# Patient Record
Sex: Male | Born: 2005 | Race: Black or African American | Hispanic: No | Marital: Single | Smoking: Never smoker
Health system: Southern US, Community
[De-identification: ages and names within clinical notes are randomized; demographics above are authoritative.]

## PROBLEM LIST (undated history)

## (undated) HISTORY — PX: TONSILLECTOMY: SUR1361

---
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
## 2024-05-31 ENCOUNTER — Encounter: Payer: Self-pay | Admitting: Family Medicine

## 2024-05-31 ENCOUNTER — Ambulatory Visit
Admission: EM | Admit: 2024-05-31 | Discharge: 2024-05-31 | Disposition: A | Discharge status: Home / Self Care | Attending: Family Medicine | Admitting: Family Medicine

## 2024-05-31 DIAGNOSIS — J069 Acute upper respiratory infection, unspecified: Secondary | ICD-10-CM | POA: Insufficient documentation

## 2024-05-31 DIAGNOSIS — U071 COVID-19: Secondary | ICD-10-CM | POA: Insufficient documentation

## 2024-05-31 LAB — RESP PANEL BY RT-PCR (FLU A&B, COVID) ARPGX2
Influenza A by PCR: NEGATIVE
Influenza B by PCR: NEGATIVE
SARS Coronavirus 2 by RT PCR: POSITIVE — AB

## 2024-05-31 LAB — GROUP A STREP BY PCR: Group A Strep by PCR: NOT DETECTED

## 2024-05-31 MED ORDER — PROMETHAZINE-DM 6.25-15 MG/5ML PO SYRP: 5.0000 mL | ORAL_SOLUTION | Freq: Four times a day (QID) | ORAL | 0 refills | Status: AC | PRN

## 2024-05-31 NOTE — ED Provider Notes (Signed)
 [facility]    CSN: [id number] Arrival date & time: 05/31/24  1438      History   Chief Complaint Chief Complaint  Patient presents with   Headache   Generalized Body Aches   Cough    HPI Shane Beck is a 18 y.o. male.   HPI  History obtained from the patient.  Shane Beck presents for headache, cough, sore throat, chest discomfort with coughing, nausea, and headache that started on 2 days ago. No wheezing and shortness of breath.  Denies fever, rhinorrhea, nasal congestion, vomiting, diarrhea or ear pain.  Took Tylenol, ibuprofen  and NyQuil.  Her grandma has been sick.    No history of asthma or lung disease. Denies smoking and vaping.       History reviewed. No pertinent past medical history.  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 Authorizing Provider  ibuprofen  (ADVIL ) 800 MG tablet Take 1 tablet (800 mg total) by mouth 3 (three) times daily. 10/06/22   White, Shelba SAUNDERS, NP  lidocaine  (XYLOCAINE ) 2 % solution Use as directed 15 mLs in the mouth or throat every 4 (four) hours as needed for mouth pain. 10/06/22   Teresa Shelba SAUNDERS, NP  naproxen  (NAPROSYN ) 500 MG tablet Take 1 tablet (500 mg total) by mouth 2 (two) times daily with a meal. 06/25/22   Yoneko Talerico, Caprice, DO  promethazine -dextromethorphan (PROMETHAZINE -DM) 6.25-15 MG/5ML syrup Take 5 mLs by mouth 4 (four) times daily as needed for cough. 05/31/24   Jace Fermin, DO  tiZANidine  (ZANAFLEX ) 4 MG tablet Take 1 tablet (4 mg total) by mouth every 8 (eight) hours as needed for muscle spasms. 06/25/22   Jerik Falletta, DO    Family History History reviewed. No pertinent family history.  Social History Social History   Tobacco Use   Smoking status: Never    Passive  exposure: Yes   Smokeless tobacco: Never  Vaping Use   Vaping status: Never Used  Substance Use Topics   Alcohol use: Never   Drug use: Never     Allergies   Patient has no known allergies.   Review of Systems Review of Systems: negative unless otherwise stated in HPI.      Physical Exam Triage Vital Signs ED Triage Vitals  Encounter Vitals Group     BP      Girls Systolic BP Percentile      Girls Diastolic BP Percentile      Boys Systolic BP Percentile      Boys Diastolic BP Percentile      Pulse      Resp      Temp      Temp src      SpO2      Weight      Height      Head Circumference      Peak Flow      Pain Score      Pain Loc      Pain Education      Exclude from Growth Chart    No data found.  Updated Vital Signs BP 127/80 (BP Location: Right Arm)   Pulse 94   Temp 99.5 F (37.5 C) (Oral)   Resp 18  Wt 98.9 kg   SpO2 95%   BMI 29.57 kg/m   Visual Acuity Right Eye Distance:   Left Eye Distance:   Bilateral Distance:    Right Eye Near:   Left Eye Near:    Bilateral Near:     Physical Exam GEN:     alert, non-toxic appearing male in no distress    HENT:  mucus membranes moist, oropharyngeal without lesions, moderate erythema, no tonsillar hypertrophy or exudates, no nasal discharge EYES:   no scleral injection or discharge NECK:  normal ROM, +lymphadenopathy, no meningismus   RESP:  no increased work of breathing, clear to auscultation bilaterally CVS:   regular rate and rhythm Skin:   warm and dry, no rash on visible skin    UC Treatments / Results  Labs (all labs ordered are listed, but only abnormal results are displayed) Labs Reviewed  RESP PANEL BY RT-PCR (FLU A&B, COVID) ARPGX2 - Abnormal; Notable for the following components:      Result Value   SARS Coronavirus 2 by RT PCR POSITIVE (*)    All other components within normal limits  GROUP A STREP BY PCR    EKG   Radiology No results found.   Procedures Procedures  (including critical care time)  Medications Ordered in UC Medications - No data to display  Initial Impression / Assessment and Plan / UC Course  I have reviewed the triage vital signs and the nursing notes.  Pertinent labs & imaging results that were available during my care of the patient were reviewed by me and considered in my medical decision making (see chart for details).       Pt is a 18 y.o. male who presents for 2 days of respiratory symptoms. Veniamin is afebrile here without recent antipyretics. Satting well on room air. Overall pt is non-toxic appearing, well hydrated, without respiratory distress. Pulmonary exam is unremarkable. Strep PCR is negative.  COVID and influenza panel obtained and was COVID positive. Discussed symptomatic treatment.  Explained lack of efficacy of antibiotics in viral disease.  After shared decision making, antivirals are declined. Promethazine  DM for cough. Typical duration of symptoms discussed. Work note provider.   Return and ED precautions given and voiced understanding. Discussed MDM, treatment plan and plan for follow-up with patient who agrees with plan.     Final Clinical Impressions(s) / UC Diagnoses   Final diagnoses:  COVID-19  URI with cough and congestion     Discharge Instructions      Your strep test is negative. Your test for COVID-19 was positive, meaning that you were infected with the novel coronavirus and could give the germ to others.  The recommendations suggest returning to normal activities when, for at least 24 hours, symptoms are improving overall, and if a fever was present, it has been gone without use of a fever-reducing medication.  You should wear a mask for the next 5 days to prevent the spread of disease. Please continue good preventive care measures, including:  frequent hand-washing, avoid touching your face, cover coughs/sneezes, stay out of crowds and keep a 6 foot distance from others.  Go to the nearest  hospital emergency room if fever/cough/breathlessness are severe or illness seems like a threat to life.  If your were prescribed medication. Stop by the pharmacy to pick it up. You can take Tylenol and/or Ibuprofen  as needed for fever reduction and pain relief.    For cough: honey 1/2 to 1 teaspoon (you can dilute the  honey in water or another fluid).  You can also use guaifenesin and dextromethorphan for cough. You can use a humidifier for chest congestion and cough.  If you don't have a humidifier, you can sit in the bathroom with the hot shower running.      For sore throat: try warm salt water gargles, Mucinex sore throat cough drops or cepacol lozenges, throat spray, warm tea or water with lemon/honey, popsicles or ice, or OTC cold relief medicine for throat discomfort. You can also purchase chloraseptic spray at the pharmacy or dollar store.   For congestion: take a daily anti-histamine like Zyrtec, Claritin, and a oral decongestant, such as pseudoephedrine.  You can also use Flonase 1-2 sprays in each nostril daily. Afrin is also a good option, if you do not have high blood pressure.    It is important to stay hydrated: drink plenty of fluids (water, gatorade/powerade/pedialyte, juices, or teas) to keep your throat moisturized and help further relieve irritation/discomfort.    Return or go to the Emergency Department if symptoms worsen or do not improve in the next few days      ED Prescriptions     Medication Sig Dispense Auth. Provider   promethazine -dextromethorphan (PROMETHAZINE -DM) 6.25-15 MG/5ML syrup Take 5 mLs by mouth 4 (four) times daily as needed for cough. 118 mL Christiaan Strebeck, DO      PDMP not reviewed this encounter.   Karin Pinedo, DO 05/31/24 1622

## 2024-05-31 NOTE — ED Triage Notes (Signed)
 Pt presents with a cough, sore throat, headache and bodyaches x 3 days. Pt has taken OTC cold medication for his symptoms.

## 2024-05-31 NOTE — Discharge Instructions (Addendum)
 Your strep test is negative. Your test for COVID-19 was positive, meaning that you were infected with the novel coronavirus and could give the germ to others.  The recommendations suggest returning to normal activities when, for at least 24 hours, symptoms are improving overall, and if a fever was present, it has been gone without use of a fever-reducing medication.  You should wear a mask for the next 5 days to prevent the spread of disease. Please continue good preventive care measures, including:  frequent hand-washing, avoid touching your face, cover coughs/sneezes, stay out of crowds and keep a 6 foot distance from others.  Go to the nearest hospital emergency room if fever/cough/breathlessness are severe or illness seems like a threat to life.  If your were prescribed medication. Stop by the pharmacy to pick it up. You can take Tylenol  and/or Ibuprofen  as needed for fever reduction and pain relief.    For cough: honey 1/2 to 1 teaspoon (you can dilute the honey in water or another fluid).  You can also use guaifenesin and dextromethorphan for cough. You can use a humidifier for chest congestion and cough.  If you don't have a humidifier, you can sit in the bathroom with the hot shower running.      For sore throat: try warm salt water gargles, Mucinex sore throat cough drops or cepacol lozenges, throat spray, warm tea or water with lemon/honey, popsicles or ice, or OTC cold relief medicine for throat discomfort. You can also purchase chloraseptic spray at the pharmacy or dollar store.   For congestion: take a daily anti-histamine like Zyrtec , Claritin, and a oral decongestant, such as pseudoephedrine.  You can also use Flonase  1-2 sprays in each nostril daily. Afrin is also a good option, if you do not have high blood pressure.    It is important to stay hydrated: drink plenty of fluids (water, gatorade/powerade/pedialyte, juices, or teas) to keep your throat moisturized and help further relieve  irritation/discomfort.    Return or go to the Emergency Department if symptoms worsen or do not improve in the next few days

## 2024-06-04 ENCOUNTER — Ambulatory Visit: Admission: EM | Admit: 2024-06-04 | Discharge: 2024-06-04 | Disposition: A | Discharge status: Home / Self Care

## 2024-06-04 ENCOUNTER — Encounter: Payer: Self-pay | Admitting: Emergency Medicine

## 2024-06-04 DIAGNOSIS — U071 COVID-19: Secondary | ICD-10-CM | POA: Diagnosis not present

## 2024-06-04 NOTE — Discharge Instructions (Signed)
 As we discussed, you are no longer considered infectious since you are on day 7 of symptoms for COVID.  You are cleared to return to work.  Your ongoing headache and loss of taste and smell will resolve in the coming weeks.

## 2024-06-04 NOTE — ED Provider Notes (Signed)
 [facility]    CSN: [id number] Arrival date & time: 06/04/24  0859      History   Chief Complaint Chief Complaint  Patient presents with   Covid Positive    HPI Shane Beck is a 18 y.o. male.   HPI  18 year old male presents with request for repeat COVID testing.  He tested positive for COVID on 05/31/24 and is still experiencing headache and loss of taste and smell.  He was supposed to return to work today but he wants to make sure he cannot infect anyone else.  History reviewed. No pertinent past medical history.  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 Authorizing Provider  ibuprofen  (ADVIL ) 800 MG tablet Take 1 tablet (800 mg total) by mouth 3 (three) times daily. 10/06/22   White, Shelba SAUNDERS, NP  lidocaine  (XYLOCAINE ) 2 % solution Use as directed 15 mLs in the mouth or throat every 4 (four) hours as needed for mouth pain. 10/06/22   Teresa Shelba SAUNDERS, NP  naproxen  (NAPROSYN ) 500 MG tablet Take 1 tablet (500 mg total) by mouth 2 (two) times daily with a meal. 06/25/22   Brimage, Caprice, DO  promethazine -dextromethorphan (PROMETHAZINE -DM) 6.25-15 MG/5ML syrup Take 5 mLs by mouth 4 (four) times daily as needed for cough. 05/31/24   Brimage, Vondra, DO  tiZANidine  (ZANAFLEX ) 4 MG tablet Take 1 tablet (4 mg total) by mouth every 8 (eight) hours as needed for muscle spasms. 06/25/22   Brimage, Vondra, DO    Family History History reviewed. No pertinent family history.  Social History Social History   Tobacco Use   Smoking status: Never    Passive exposure: Yes   Smokeless tobacco: Never  Vaping Use   Vaping status: Never Used  Substance Use Topics   Alcohol use: Never   Drug use: Never     Allergies   Patient has no known  allergies.   Review of Systems Review of Systems  HENT:         Ongoing loss of taste and smell  Neurological:  Positive for headaches.     Physical Exam Triage Vital Signs ED Triage Vitals  Encounter Vitals Group     BP      Girls Systolic BP Percentile      Girls Diastolic BP Percentile      Boys Systolic BP Percentile      Boys Diastolic BP Percentile      Pulse      Resp      Temp      Temp src      SpO2      Weight      Height      Head Circumference      Peak Flow      Pain Score      Pain Loc      Pain Education      Exclude from Growth Chart    No data found.  Updated Vital Signs BP 125/89 (BP Location: Right Arm)   Pulse 67   Temp 98.9 F (37.2 C) (Oral)   Resp 15   Ht 6' (1.829 m)   Wt 218 lb 0.6 oz (98.9 kg)   SpO2 97%  BMI 29.57 kg/m   Visual Acuity Right Eye Distance:   Left Eye Distance:   Bilateral Distance:    Right Eye Near:   Left Eye Near:    Bilateral Near:     Physical Exam Vitals and nursing note reviewed.  Constitutional:      Appearance: Normal appearance. He is not ill-appearing.  HENT:     Head: Normocephalic and atraumatic.  Skin:    General: Skin is warm and dry.     Capillary Refill: Capillary refill takes less than 2 seconds.     Findings: No rash.  Neurological:     General: No focal deficit present.     Mental Status: He is alert and oriented to person, place, and time.      UC Treatments / Results  Labs (all labs ordered are listed, but only abnormal results are displayed) Labs Reviewed - No data to display  EKG   Radiology No results found.  Procedures Procedures (including critical care time)  Medications Ordered in UC Medications - No data to display  Initial Impression / Assessment and Plan / UC Course  I have reviewed the triage vital signs and the nursing notes.  Pertinent labs & imaging results that were available during my care of the patient were reviewed by me and considered in my  medical decision making (see chart for details).   Patient is a nontoxic-appearing 18 year old male presenting with request for repeat COVID testing.  He tested positive on September 1 after experiencing respiratory symptoms for 2 days.  He was given a note to be off until today, but he wanted repeat testing to ensure that he would not infect anyone else.  I have advised the patient that we will not retest him because even if he test positive he is beyond the infectious window of 5 days given that he is on day 7 of symptoms.  His ongoing headache and loss of taste and smell are typical residual COVID symptoms and may take a week or more to resolve.  He has not run any fevers and he is currently afebrile with an oral temp of 98.9.  He is no longer infectious and I am releasing him to work.   Final Clinical Impressions(s) / UC Diagnoses   Final diagnoses:  COVID-19     Discharge Instructions      As we discussed, you are no longer considered infectious since you are on day 7 of symptoms for COVID.  You are cleared to return to work.  Your ongoing headache and loss of taste and smell will resolve in the coming weeks.     ED Prescriptions   None    PDMP not reviewed this encounter.   Bernardino Ditch, NP 06/04/24 [phone]

## 2024-06-04 NOTE — ED Triage Notes (Signed)
 Patient was diagnosed with COVID on 06/01/23.  Patient states that he wants to be retested for COVID to make sure if he still has COVID.  Patient states that he will also need a doctor's note to return to work.
